# Patient Record
Sex: Female | Born: 1965 | Race: White | Hispanic: No | Smoking: Never smoker
Health system: Southern US, Community
[De-identification: ages and names within clinical notes are randomized; demographics above are authoritative.]

## PROBLEM LIST (undated history)

## (undated) HISTORY — PX: REDUCTION MAMMAPLASTY: SUR839

## (undated) HISTORY — PX: LASIK: SHX215

---

## 2005-01-22 ENCOUNTER — Ambulatory Visit: Payer: Self-pay | Admitting: Internal Medicine

## 2005-02-25 ENCOUNTER — Ambulatory Visit: Payer: Self-pay | Admitting: Gastroenterology

## 2005-05-05 ENCOUNTER — Ambulatory Visit: Payer: Self-pay | Admitting: Gastroenterology

## 2006-06-16 DIAGNOSIS — E039 Hypothyroidism, unspecified: Secondary | ICD-10-CM

## 2006-06-16 HISTORY — DX: Hypothyroidism, unspecified: E03.9

## 2007-03-23 ENCOUNTER — Ambulatory Visit: Payer: Self-pay | Admitting: Internal Medicine

## 2008-08-03 IMAGING — MG MAM DGTL SCREENING MAMMO W/CAD
1 series · 4 of 4 positions shown · non-contrast
Comparison: none

REASON FOR EXAM: Screening mammogram  Fax report to Dr. [REDACTED] #:
COMMENTS:

[Series 101: R CC · right · 4 of 4 slices shown]
[im 1/4]
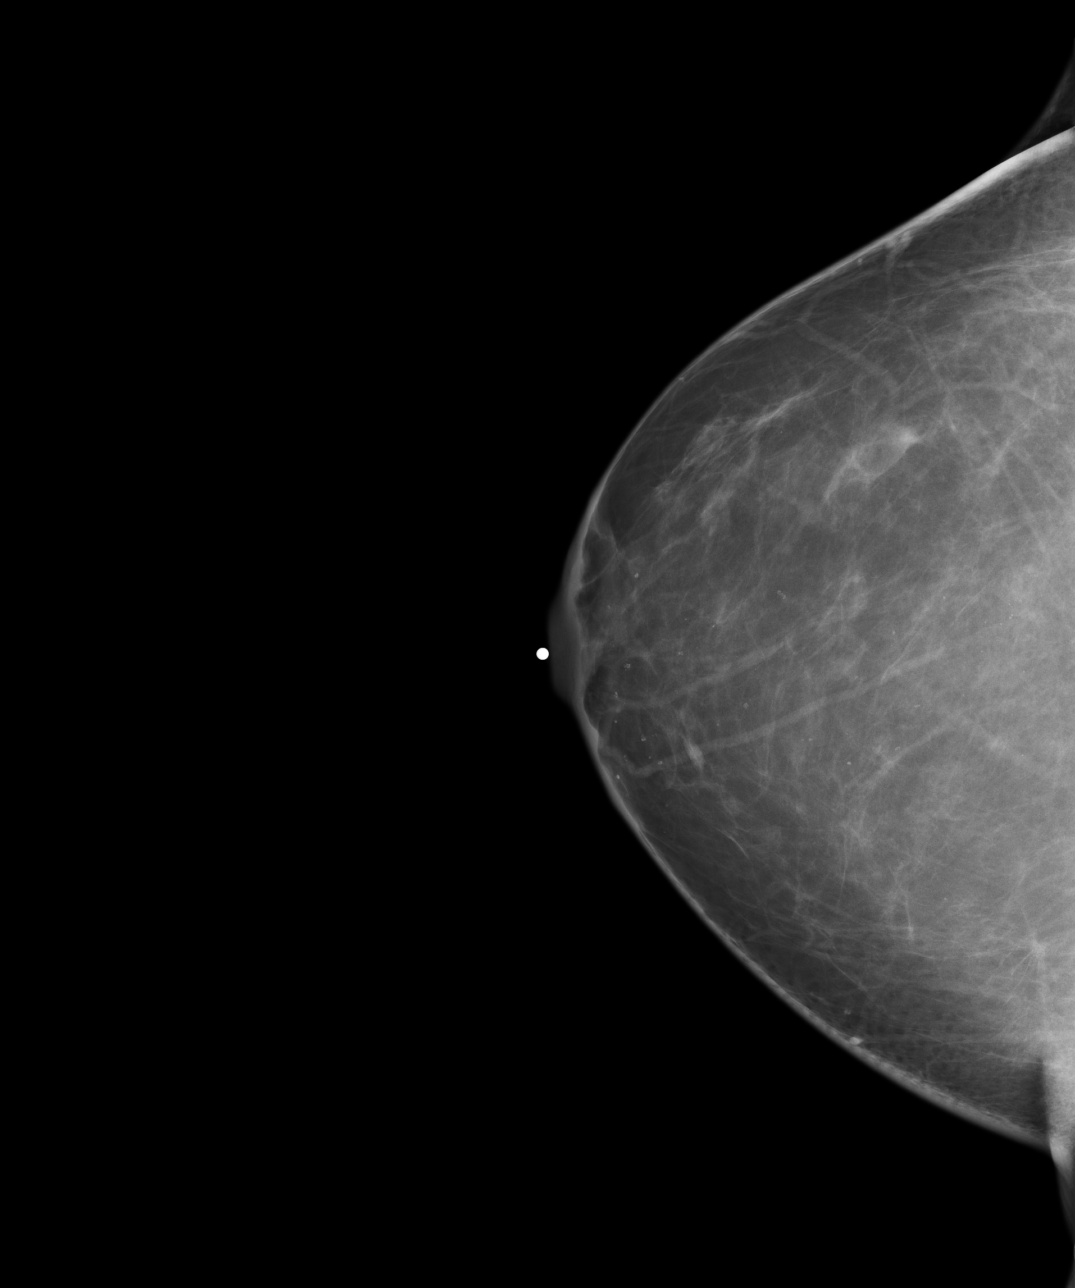
[im 2/4]
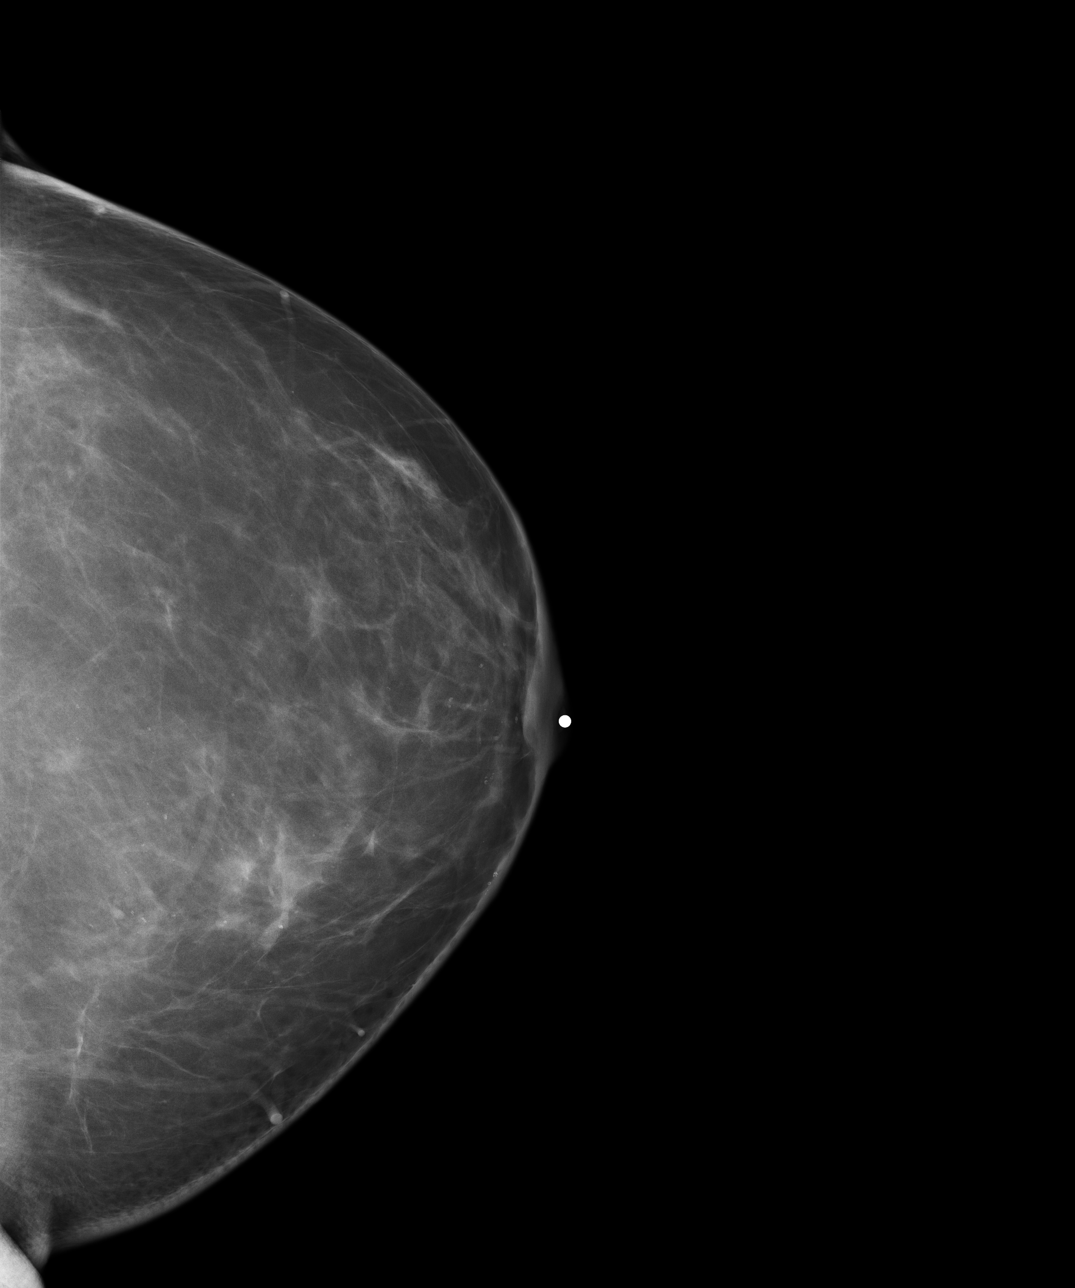
[im 3/4]
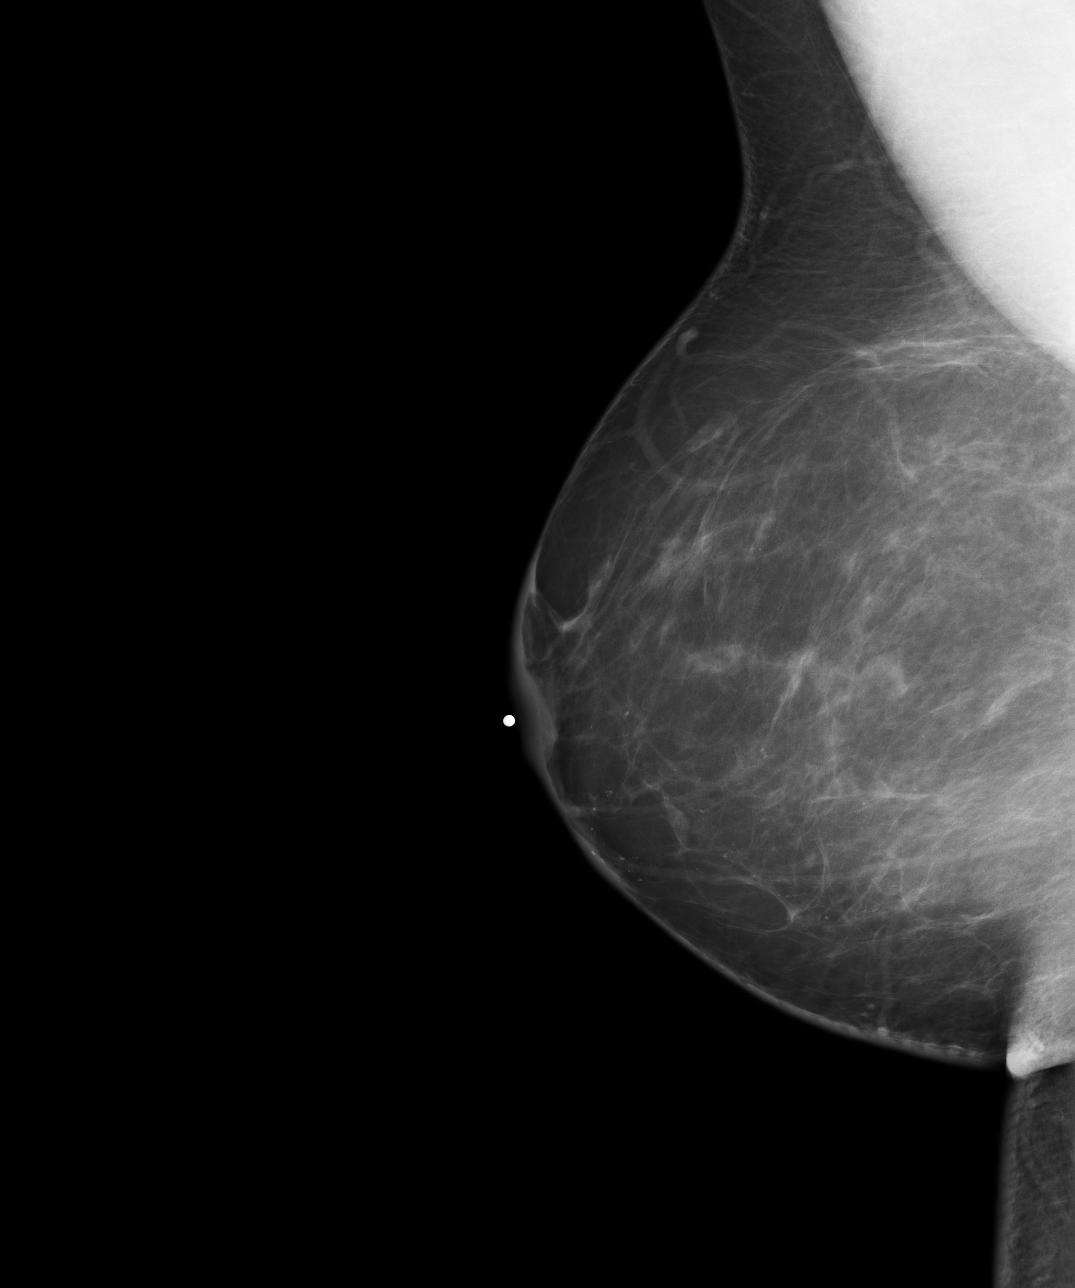
[im 4/4]
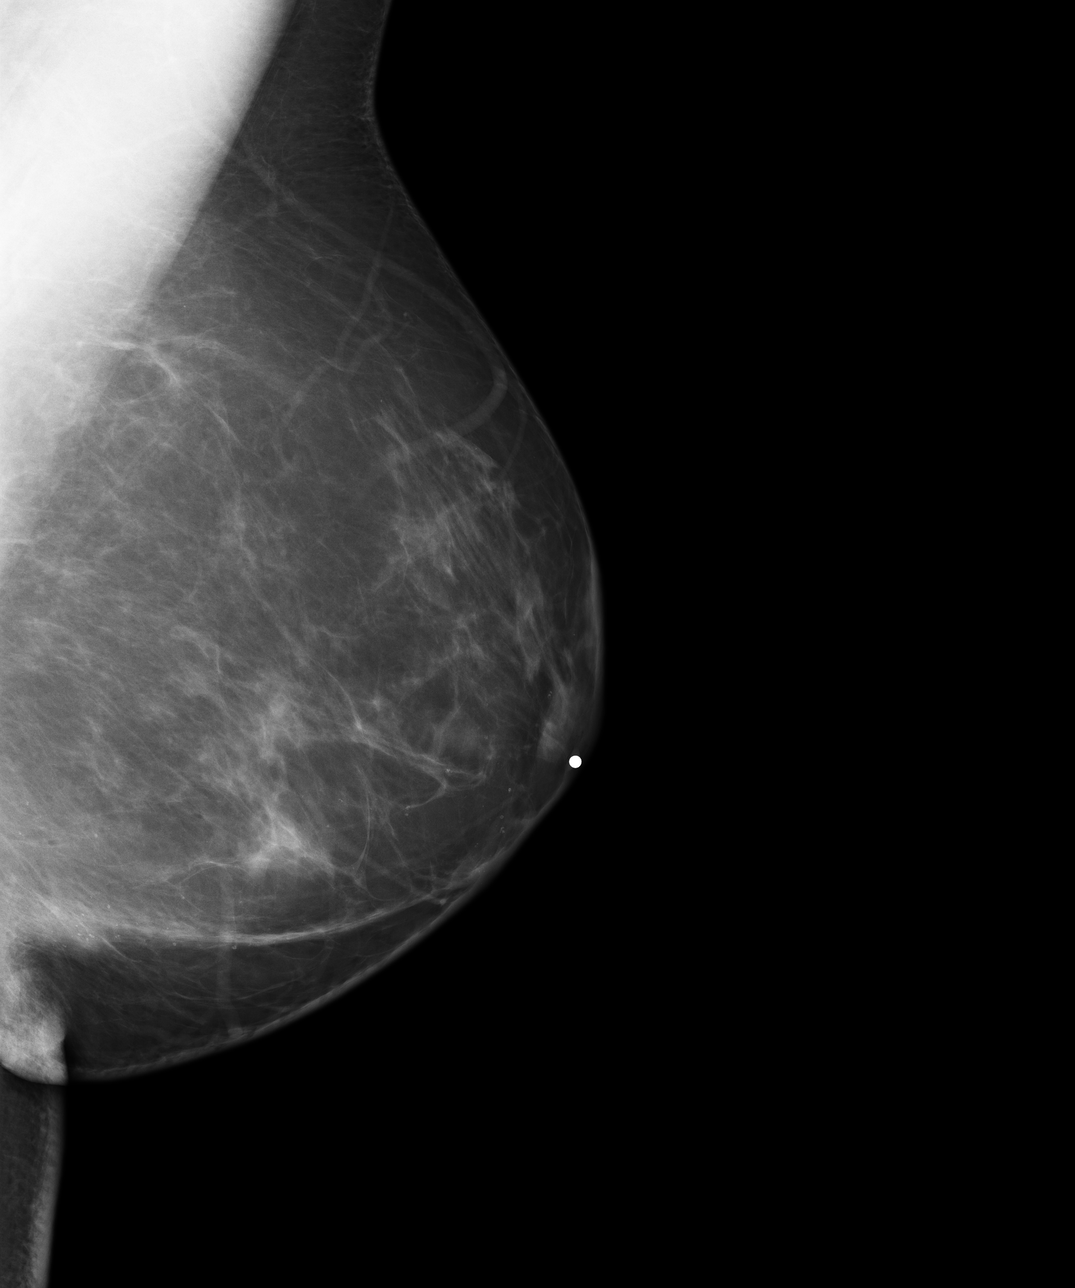

[4 of 4 positions shown; findings below may reference images not displayed]

PROCEDURE:     MAM - MAM DGTL SCREENING MAMMO W/CAD  - March 23, 2007 [DATE]

RESULT:     There are no prior mammograms available for comparison.

The current exam reveals no mass or malignant appearing calcifications.
Benign appearing calcifications are noted bilaterally. The breast parenchyma
is predominantly fatty.
IMPRESSION: 1.     Bilaterally benign appearing mammography.
2.     Annual screening mammography is recommended.
3.     BI-RADS: Category 2 - Benign Finding.

Thank you for this opportunity to contribute to the care of your patient.

A NEGATIVE MAMMOGRAM REPORT DOES NOT PRECLUDE BIOPSY OR OTHER EVALUATION OF
A CLINICALLY PALPABLE OR OTHERWISE SUSPICIOUS MASS OR LESION. BREAST CANCER
MAY NOT BE DETECTED BY MAMMOGRAPHY IN UP TO 10% OF CASES.

## 2010-01-11 ENCOUNTER — Ambulatory Visit: Payer: Self-pay | Admitting: Obstetrics and Gynecology

## 2011-05-25 IMAGING — MG MAM DGTL SCREENING MAMMO W/CAD
1 series · 4 of 4 positions shown · non-contrast
Comparison: 03-21-07.

REASON FOR EXAM: SCR
COMMENTS:

PROCEDURE:     MAM - MAM DGTL SCREENING MAMMO W/CAD  - January 11, 2010 [DATE]
RESULT:

[Series 4710: R CC · right · 4 of 4 slices shown]
[im 1/4]
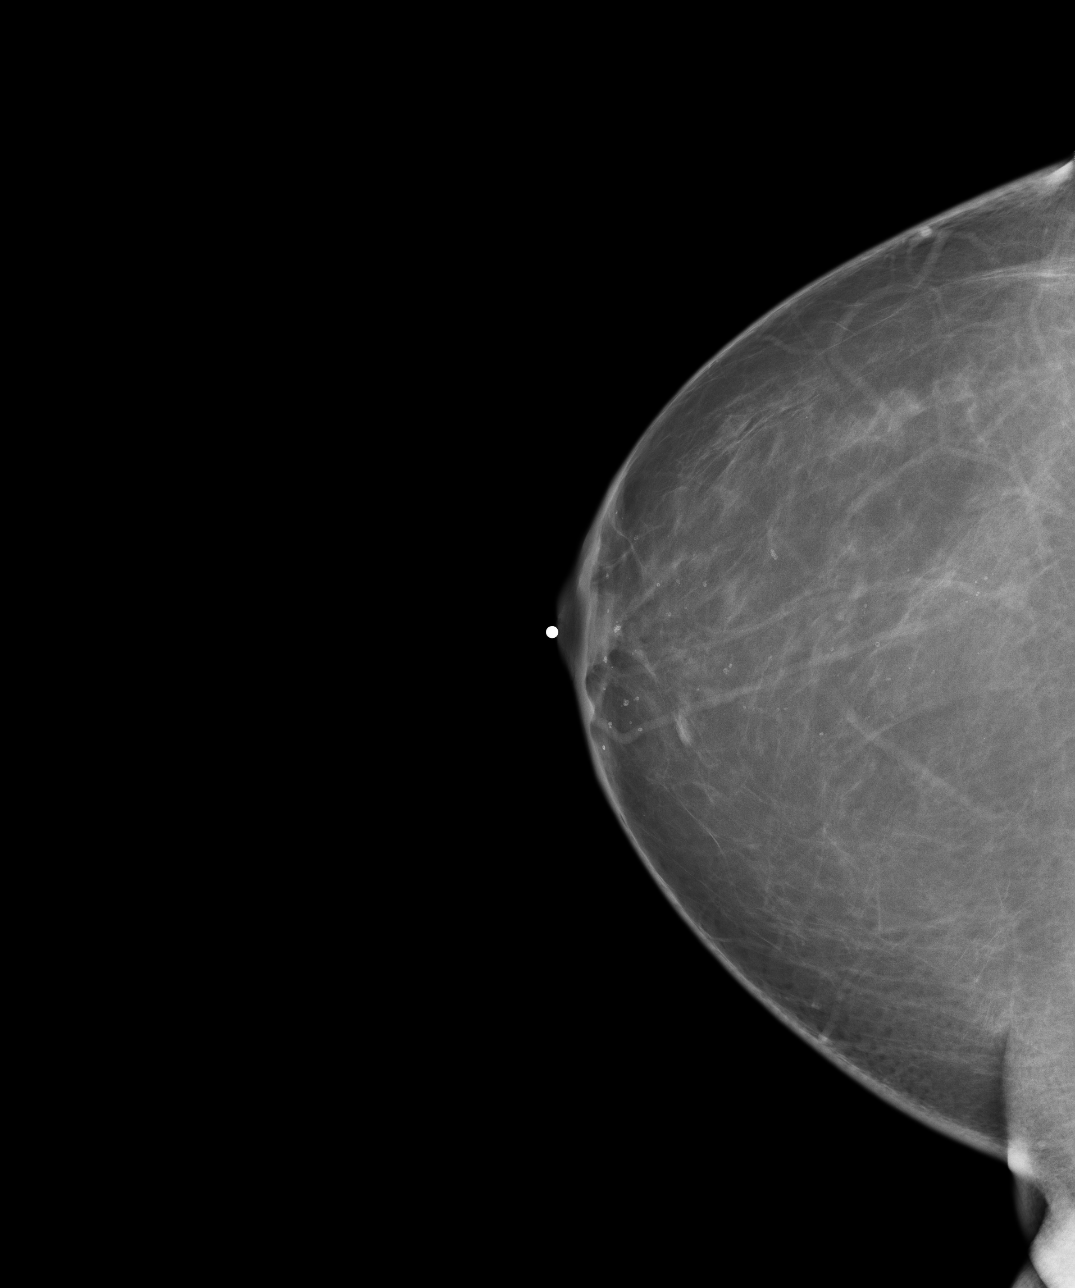
[im 2/4]
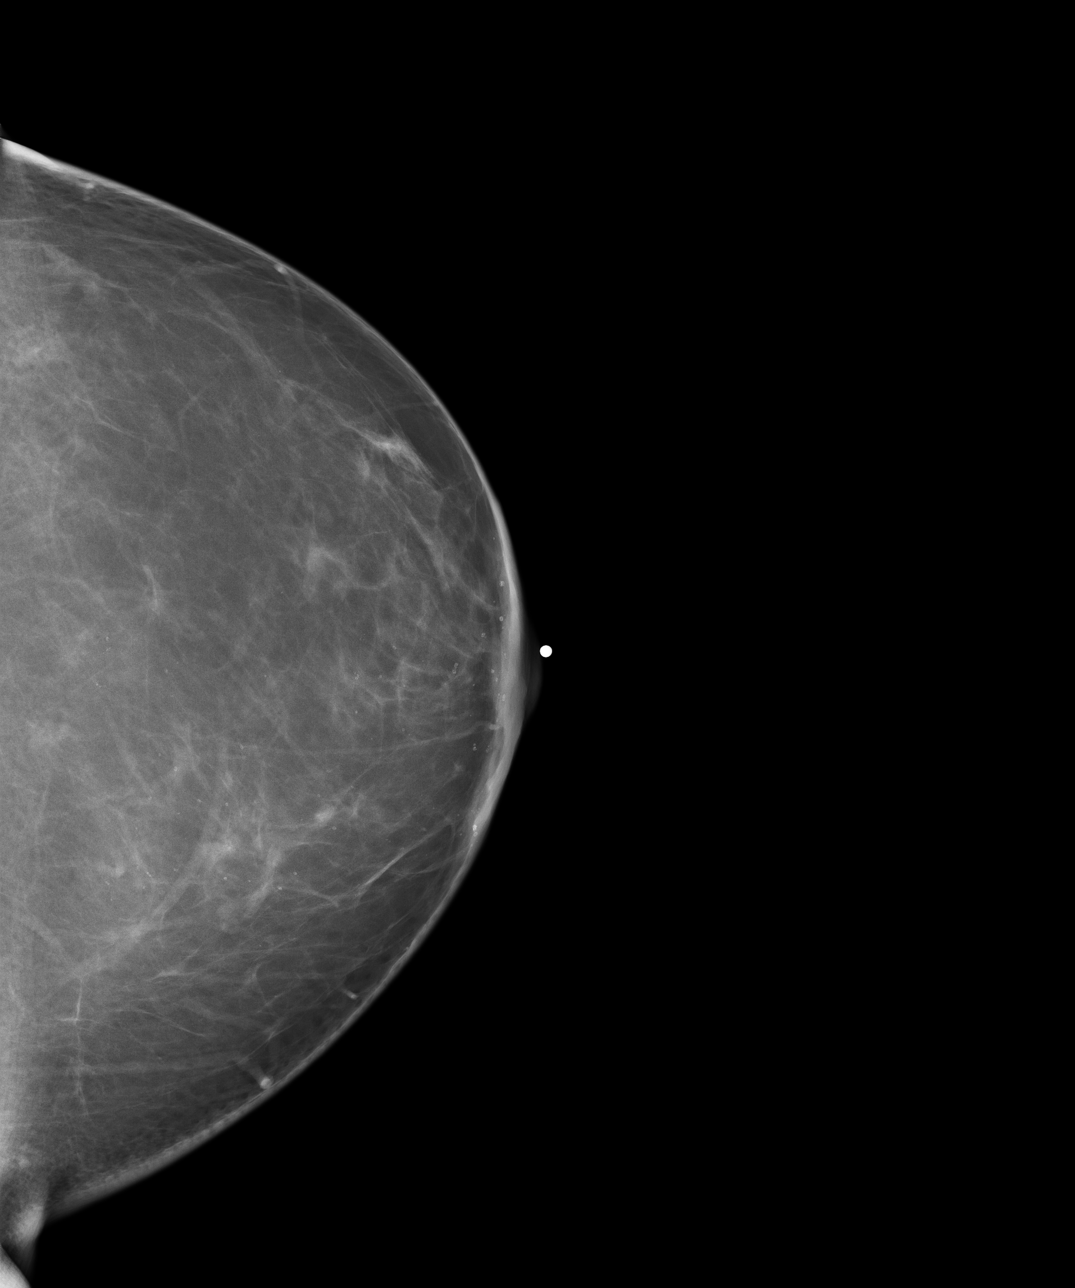
[im 3/4]
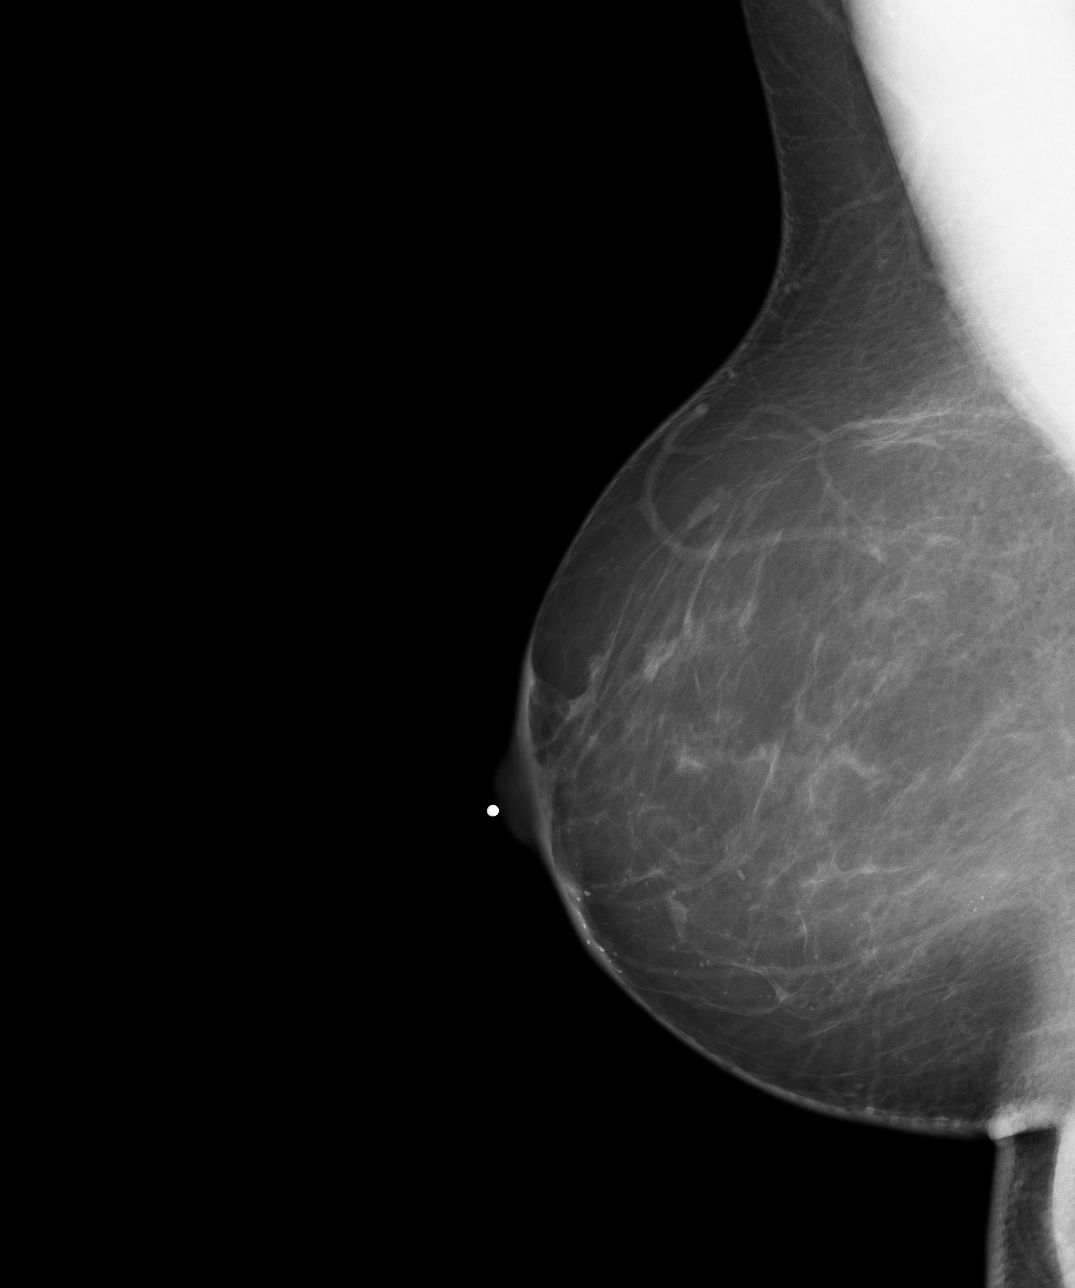
[im 4/4]
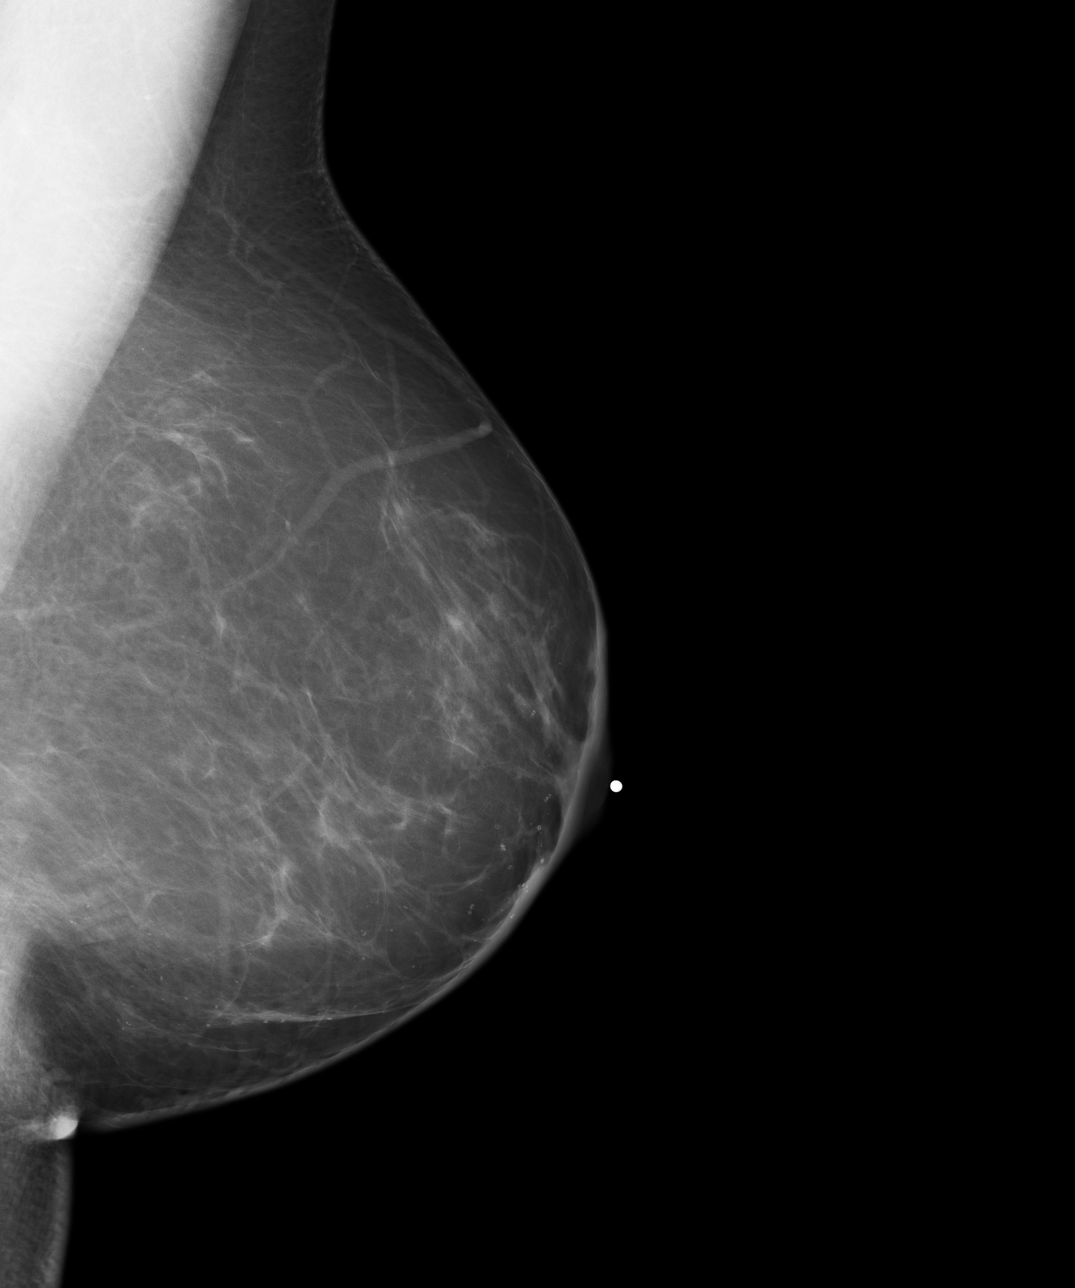

[4 of 4 positions shown; findings below may reference images not displayed]

FINDINGS: There is scattered fibroglandular tissue.  There are scattered,
benign appearing calcifications which are similar to the prior study. No new
or suspicious masses or calcifications are identified.
IMPRESSION: BI-RADS: Category 2 - Benign Findings

Continued annual screening mammography.

A NEGATIVE MAMMOGRAM REPORT DOES NOT PRECLUDE BIOPSY OR OTHER EVALUATION OF
A CLINICALLY PALPABLE OR OTHERWISE SUSPICIOUS MASS OR LESION. BREAST CANCER
MAY NOT BE DETECTED BY MAMMOGRAPHY IN UP TO 10% OF CASES.

## 2011-08-05 ENCOUNTER — Ambulatory Visit: Payer: Self-pay | Admitting: Obstetrics and Gynecology

## 2012-10-29 ENCOUNTER — Ambulatory Visit: Payer: Self-pay | Admitting: Obstetrics and Gynecology

## 2012-12-16 IMAGING — MG MMM DGT SCR NO ORDER W/CAD
1 series · 4 of 4 positions shown · non-contrast
Comparison: none

REASON FOR EXAM: SCR MAMMO NO ORDER
COMMENTS:

[Series 5450: R CC · right · 4 of 4 slices shown]
[im 1/4]
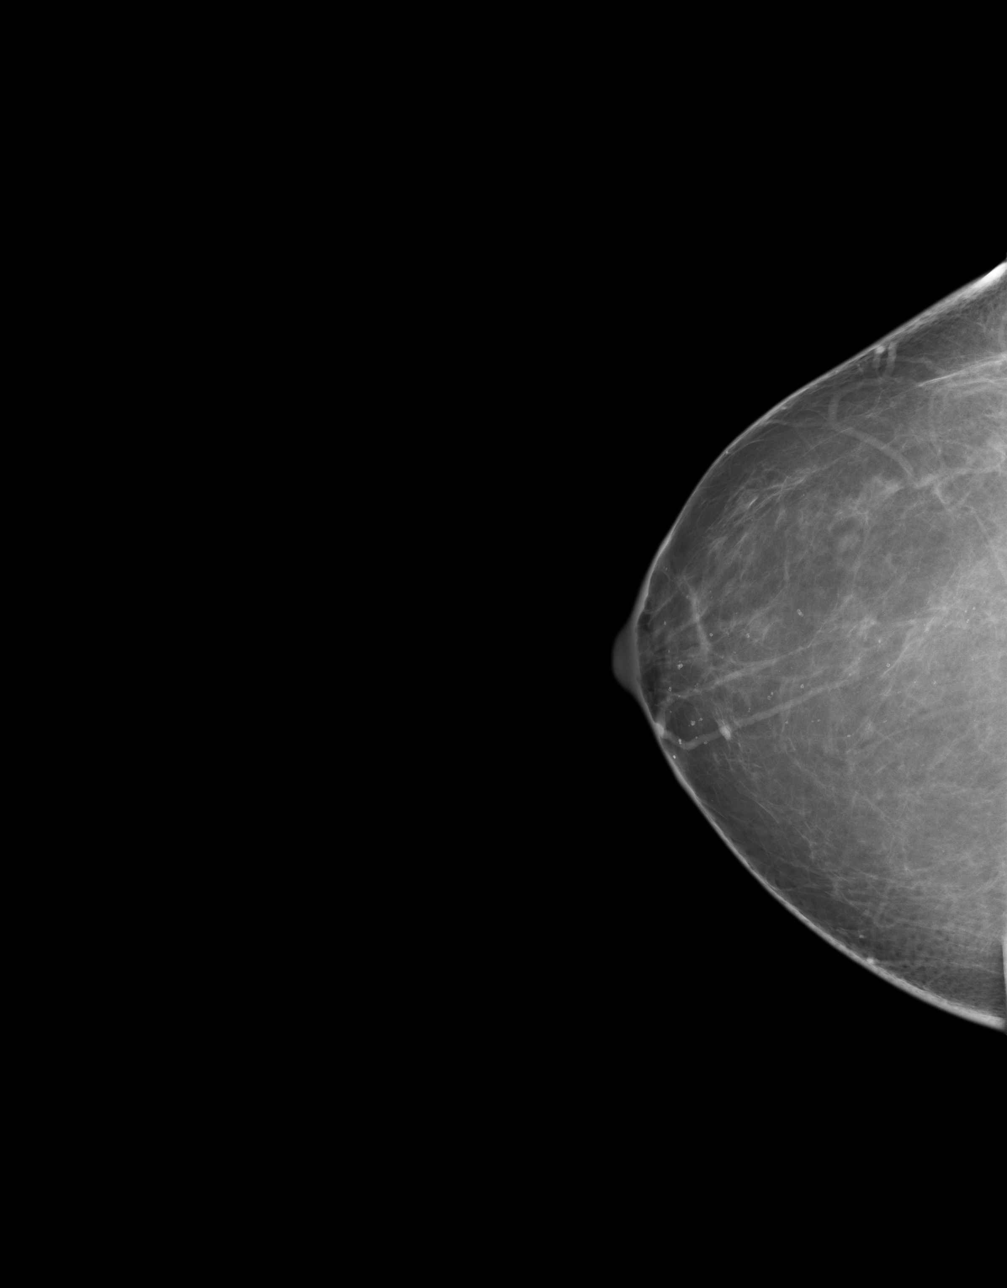
[im 2/4]
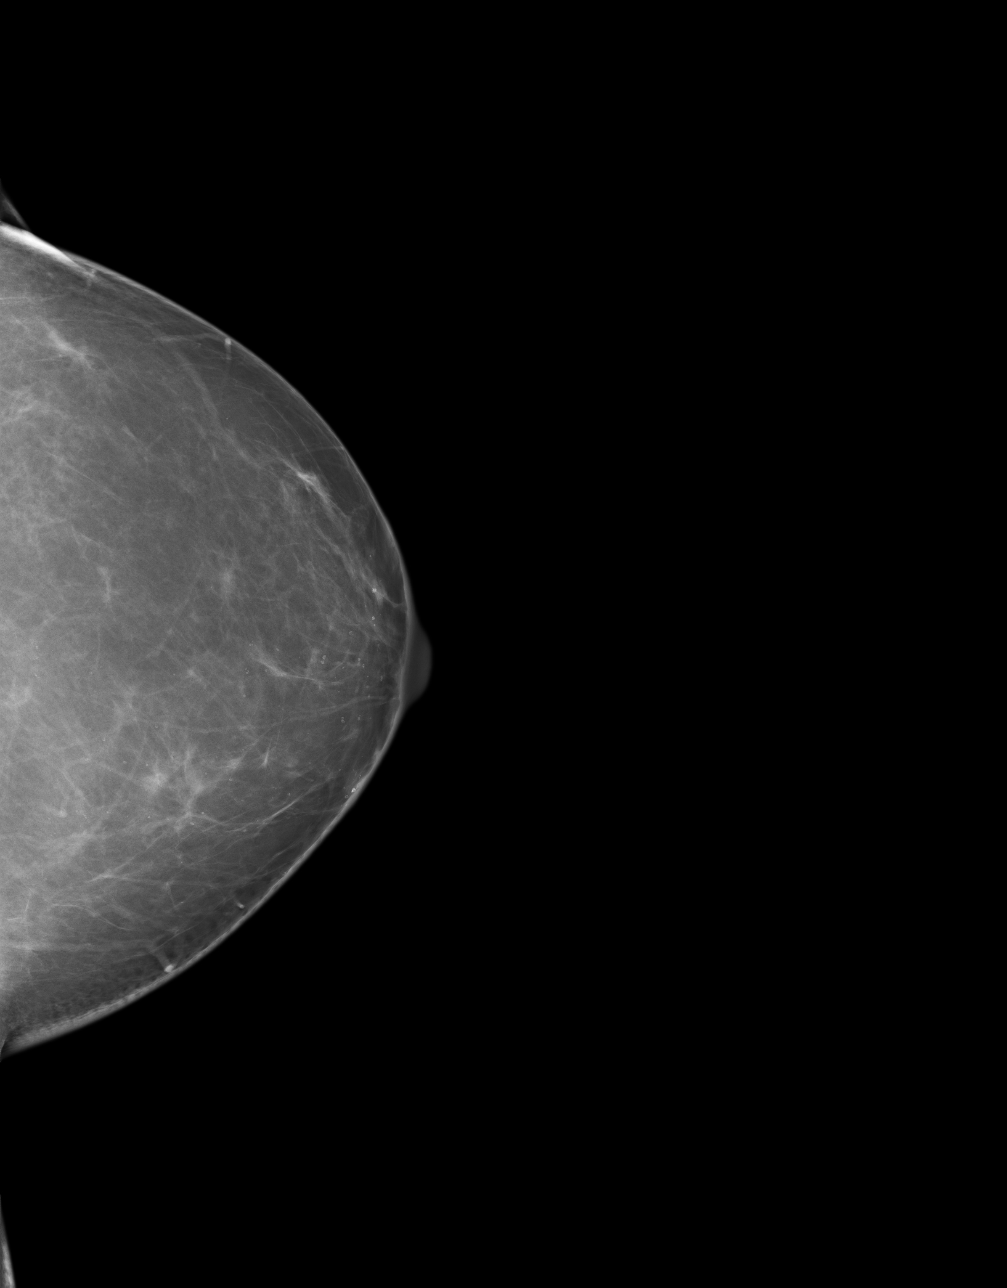
[im 3/4]
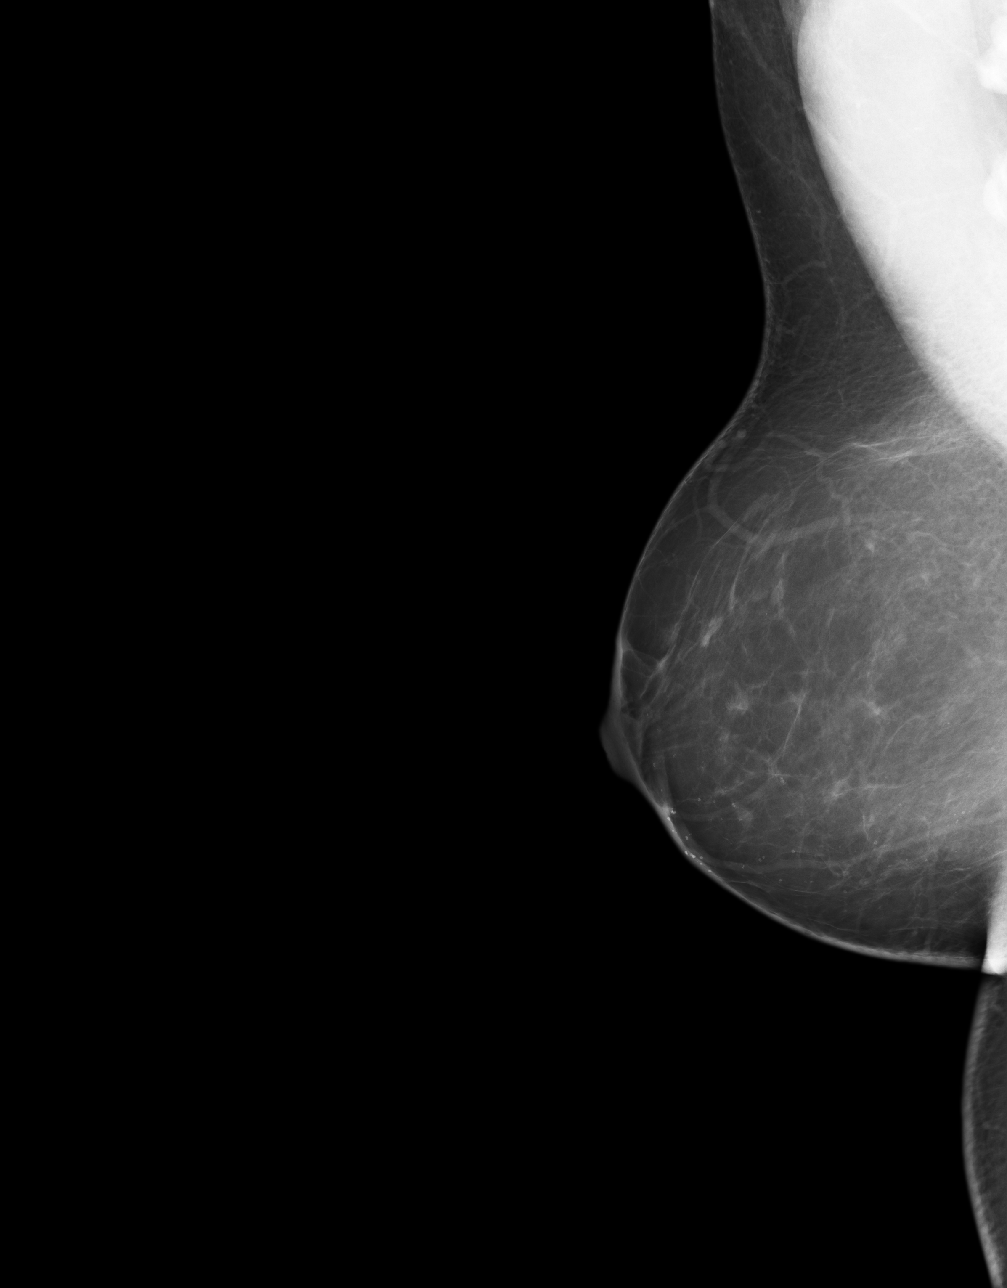
[im 4/4]
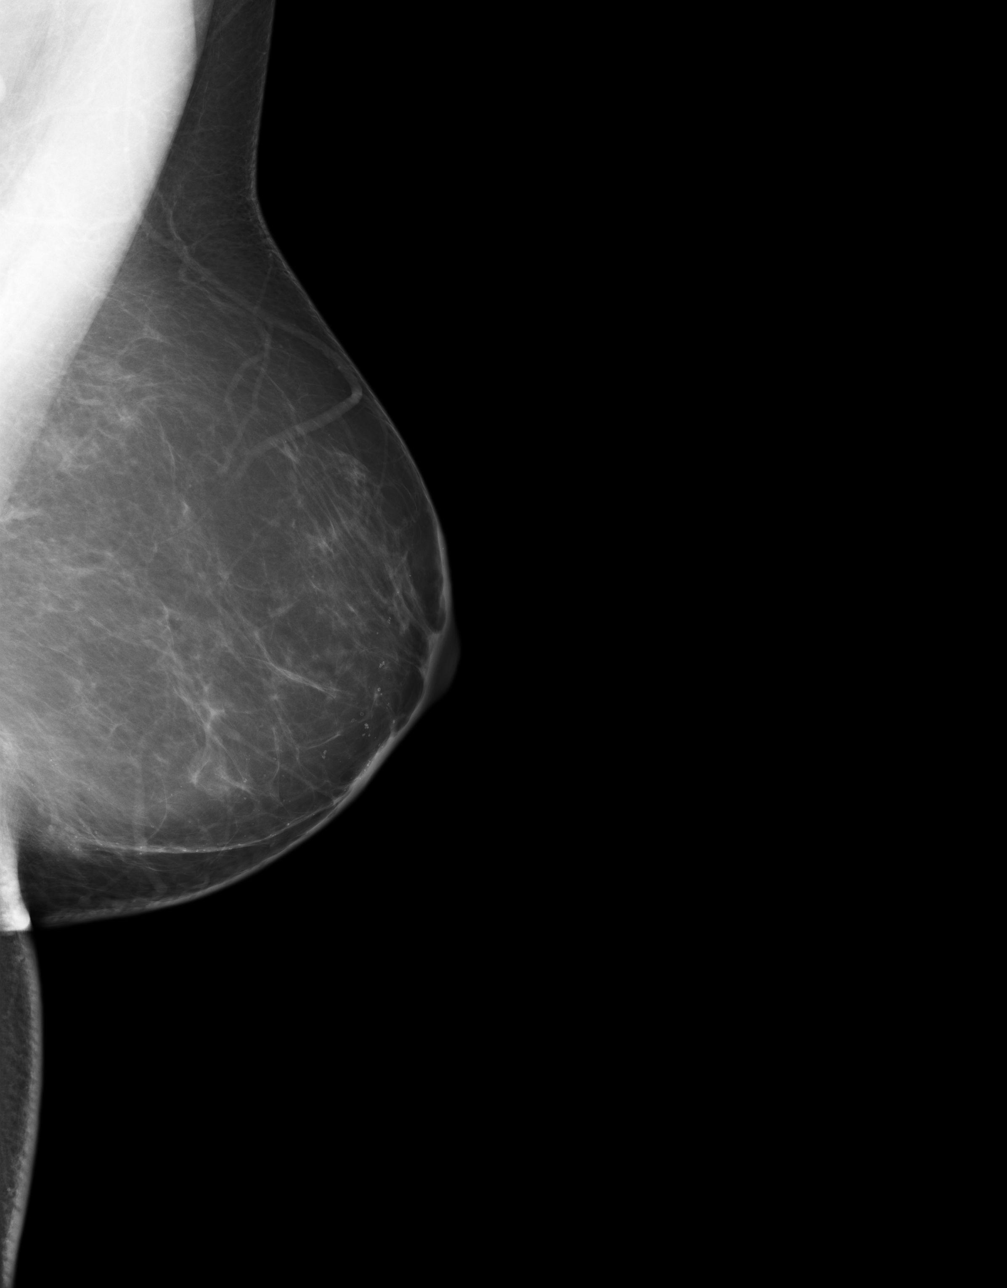

[4 of 4 positions shown; findings below may reference images not displayed]

PROCEDURE:     MMM - MMM DGT SCR NO ORDER W/CAD  - August 05, 2011  [DATE]

RESULT:

Comparison is made to prior examinations January 11, 2010 and March 23, 2007.

There are scattered fibroglandular densities bilaterally. No mass or
malignant appearing microcalcifications are seen. Benign appearing
calcifications are again noted bilaterally.
IMPRESSION: 1.  Bilaterally benign appearing screening mammography.
2.  Continued annual screening mammography is recommended.
3.  BI-RADS: Category 2 - Benign Findings.

Thank you for this opportunity to contribute to the care of your patient.

A NEGATIVE MAMMOGRAM REPORT DOES NOT PRECLUDE BIOPSY OR OTHER EVALUATION OF
A CLINICALLY PALPABLE OR OTHERWISE SUSPICIOUS MASS OR LESION. BREAST CANCER
MAY NOT BE DETECTED BY MAMMOGRAPHY IN UP TO 10% OF CASES.

## 2014-03-12 IMAGING — MG MAM DGTL SCRN MAM NO ORDER W/CAD
1 series · 5 of 5 positions shown · non-contrast
Comparison: none

REASON FOR EXAM: SCR MAMMO NO ORDER
COMMENTS:

[R CC · right · 5 of 5 slices shown]
[im 1/5]
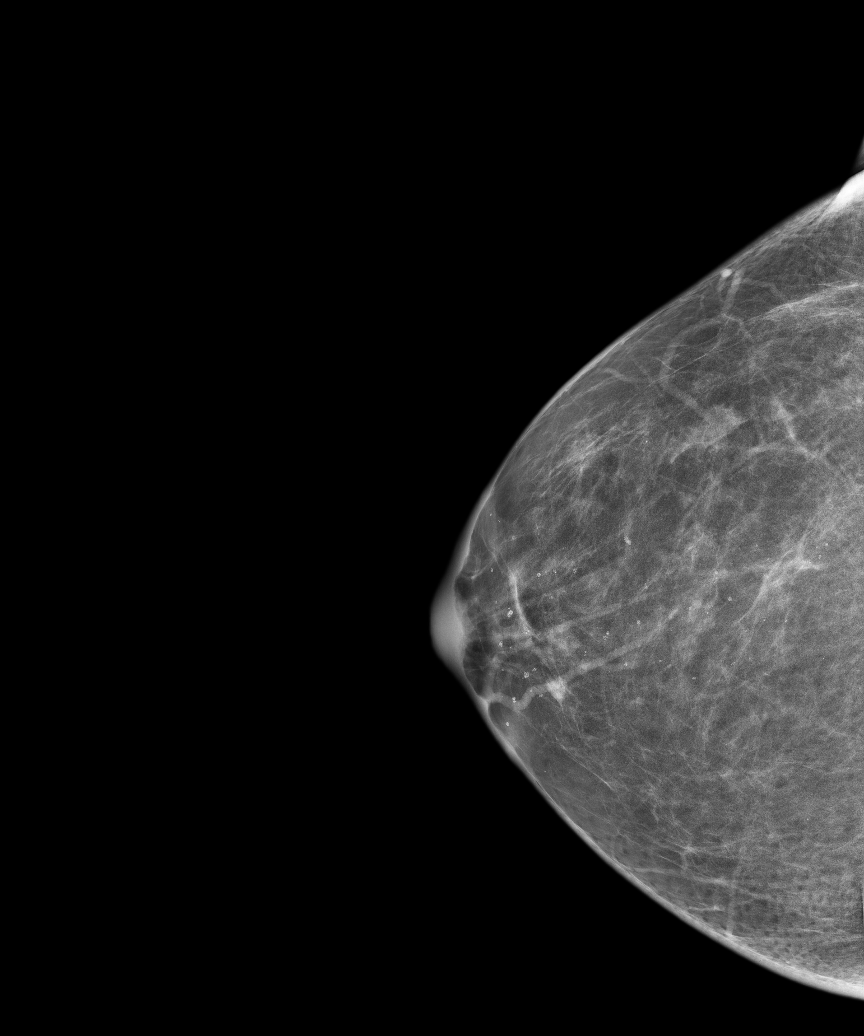
[im 2/5]
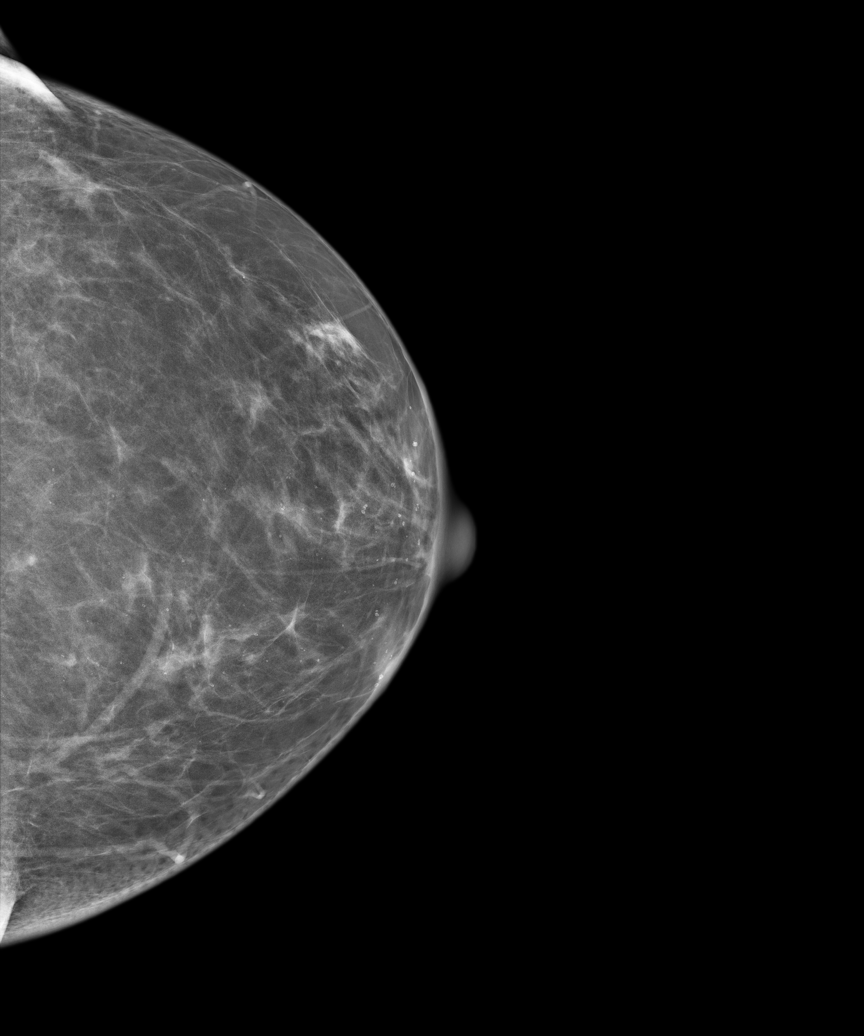
[im 3/5]
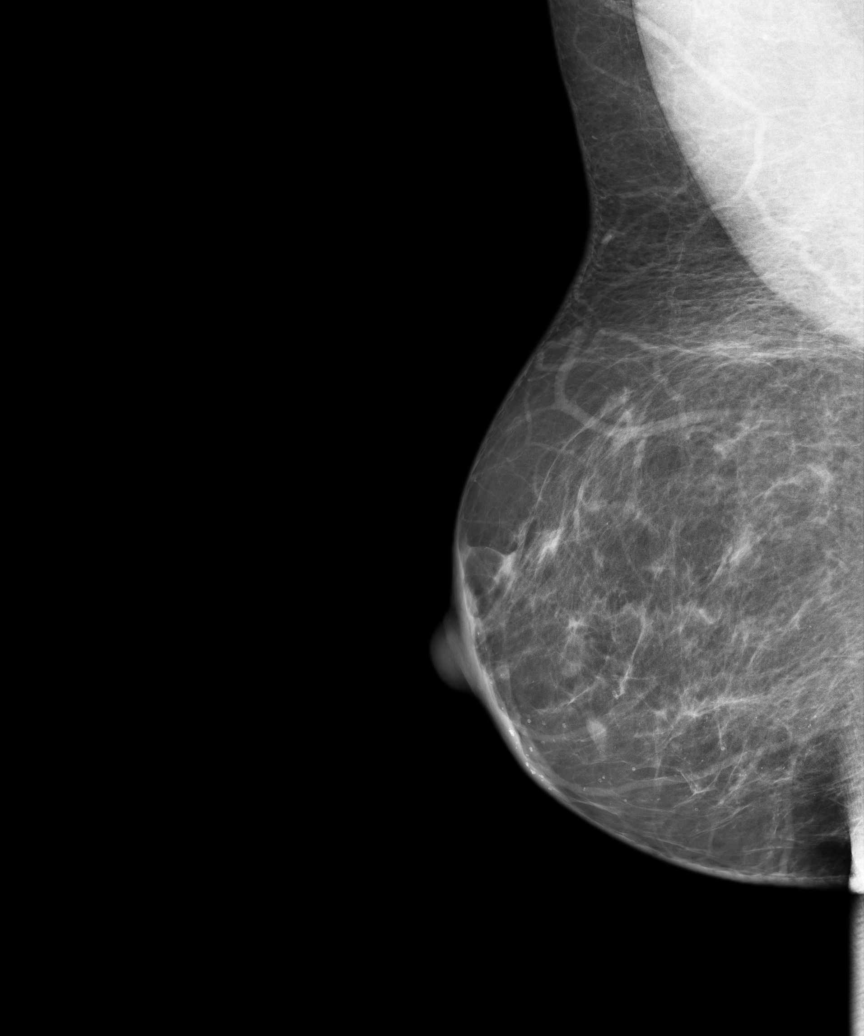
[im 4/5]
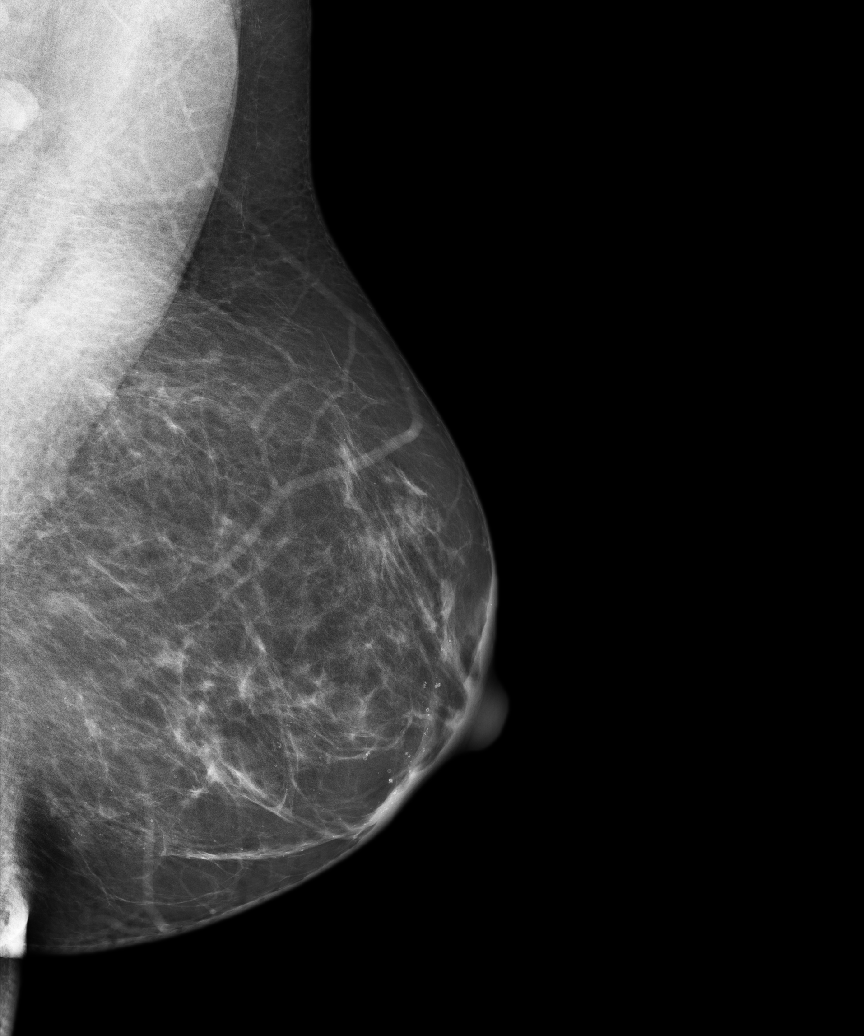
[im 5/5]
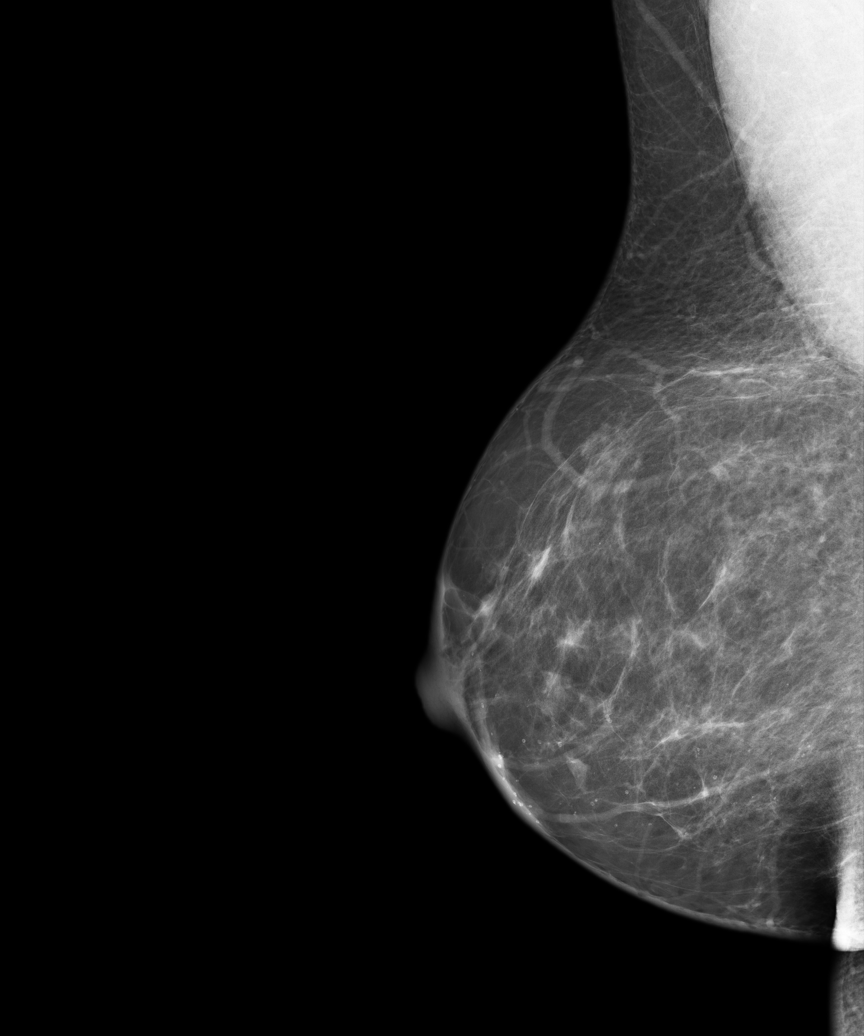

[5 of 5 positions shown; findings below may reference images not displayed]

PROCEDURE:     MAM - MAM DGTL SCRN MAM NO ORDER W/CAD  - October 29, 2012 [DATE]

RESULT:     There is reported family history of breast cancer in the
patient's maternal great aunt. Patient reports previous bilateral breast
reduction in 8003. Comparison is made to previous digital mammographic
images of 08/05/2011, 01/11/2010 and 03/23/2007.

There is scattered fibroglandular density in both breasts with multiple
predominantly round or punctate calcifications in the mid to anterior
portion of the right breast and left breast. There is no developing or
dominant mass, malignant calcification or architectural distortion. The
mammographic appearance is stable.
IMPRESSION: Stable, benign appearing bilateral mammogram. Please
continue to encourage annual mammographic followup and monthly breast self
exam. BREAST COMPOSITION: The breast composition is SCATTERED FIBROGLANDULAR
TISSUE (glandular tissue is 25-50%) BI-RADS: Category 2- Benign Finding

A NEGATIVE MAMMOGRAM REPORT DOES NOT PRECLUDE BIOPSY OR OTHER EVALUATION OF
A CLINICALLY PALPABLE OR OTHERWISE SUSPICIOUS MASS OR LESION. BREAST CANCER
MAY NOT BE DETECTED IN UP TO 10% OF CASES.

[REDACTED]

## 2016-07-08 DIAGNOSIS — Z13220 Encounter for screening for lipoid disorders: Secondary | ICD-10-CM | POA: Diagnosis not present

## 2016-07-08 DIAGNOSIS — Z131 Encounter for screening for diabetes mellitus: Secondary | ICD-10-CM | POA: Diagnosis not present

## 2016-07-08 DIAGNOSIS — Z130 Encounter for screening for diseases of the blood and blood-forming organs and certain disorders involving the immune mechanism: Secondary | ICD-10-CM | POA: Diagnosis not present

## 2016-07-08 DIAGNOSIS — E039 Hypothyroidism, unspecified: Secondary | ICD-10-CM | POA: Diagnosis not present

## 2016-07-15 ENCOUNTER — Other Ambulatory Visit: Payer: Self-pay | Admitting: Obstetrics and Gynecology

## 2016-07-15 DIAGNOSIS — Z23 Encounter for immunization: Secondary | ICD-10-CM | POA: Diagnosis not present

## 2016-07-15 DIAGNOSIS — Z01419 Encounter for gynecological examination (general) (routine) without abnormal findings: Secondary | ICD-10-CM | POA: Diagnosis not present

## 2016-07-15 DIAGNOSIS — E039 Hypothyroidism, unspecified: Secondary | ICD-10-CM | POA: Diagnosis not present

## 2016-07-15 DIAGNOSIS — E782 Mixed hyperlipidemia: Secondary | ICD-10-CM | POA: Diagnosis not present

## 2016-07-15 DIAGNOSIS — Z1231 Encounter for screening mammogram for malignant neoplasm of breast: Secondary | ICD-10-CM

## 2016-07-15 DIAGNOSIS — Z1211 Encounter for screening for malignant neoplasm of colon: Secondary | ICD-10-CM | POA: Diagnosis not present

## 2016-07-15 DIAGNOSIS — M2550 Pain in unspecified joint: Secondary | ICD-10-CM | POA: Diagnosis not present

## 2016-07-23 DIAGNOSIS — T2122XA Burn of second degree of abdominal wall, initial encounter: Secondary | ICD-10-CM | POA: Diagnosis not present

## 2016-07-23 DIAGNOSIS — T2112XA Burn of first degree of abdominal wall, initial encounter: Secondary | ICD-10-CM | POA: Diagnosis not present

## 2016-07-23 DIAGNOSIS — T22122A Burn of first degree of left elbow, initial encounter: Secondary | ICD-10-CM | POA: Diagnosis not present

## 2016-07-25 DIAGNOSIS — R079 Chest pain, unspecified: Secondary | ICD-10-CM | POA: Diagnosis not present

## 2016-07-25 DIAGNOSIS — I10 Essential (primary) hypertension: Secondary | ICD-10-CM | POA: Diagnosis not present

## 2016-07-26 DIAGNOSIS — R079 Chest pain, unspecified: Secondary | ICD-10-CM | POA: Diagnosis not present

## 2016-07-31 DIAGNOSIS — R079 Chest pain, unspecified: Secondary | ICD-10-CM | POA: Diagnosis not present

## 2016-08-04 DIAGNOSIS — E782 Mixed hyperlipidemia: Secondary | ICD-10-CM | POA: Diagnosis not present

## 2016-08-04 DIAGNOSIS — R079 Chest pain, unspecified: Secondary | ICD-10-CM | POA: Diagnosis not present

## 2016-08-04 DIAGNOSIS — I208 Other forms of angina pectoris: Secondary | ICD-10-CM | POA: Diagnosis not present

## 2016-08-14 DIAGNOSIS — E782 Mixed hyperlipidemia: Secondary | ICD-10-CM | POA: Diagnosis not present

## 2016-08-14 DIAGNOSIS — I10 Essential (primary) hypertension: Secondary | ICD-10-CM | POA: Diagnosis not present

## 2016-08-14 DIAGNOSIS — I208 Other forms of angina pectoris: Secondary | ICD-10-CM | POA: Diagnosis not present

## 2016-08-14 DIAGNOSIS — R079 Chest pain, unspecified: Secondary | ICD-10-CM | POA: Diagnosis not present

## 2016-10-02 DIAGNOSIS — S80862A Insect bite (nonvenomous), left lower leg, initial encounter: Secondary | ICD-10-CM | POA: Diagnosis not present

## 2016-10-02 DIAGNOSIS — L821 Other seborrheic keratosis: Secondary | ICD-10-CM | POA: Diagnosis not present

## 2016-10-02 DIAGNOSIS — D225 Melanocytic nevi of trunk: Secondary | ICD-10-CM | POA: Diagnosis not present

## 2016-10-02 DIAGNOSIS — D485 Neoplasm of uncertain behavior of skin: Secondary | ICD-10-CM | POA: Diagnosis not present

## 2016-11-24 ENCOUNTER — Ambulatory Visit
Admission: RE | Admit: 2016-11-24 | Discharge: 2016-11-24 | Disposition: A | Discharge status: Home / Self Care | Payer: Commercial Managed Care - HMO | Source: Ambulatory Visit | Attending: Obstetrics and Gynecology | Admitting: Obstetrics and Gynecology

## 2016-11-24 ENCOUNTER — Encounter: Payer: Self-pay | Admitting: Radiology

## 2016-11-24 DIAGNOSIS — Z1231 Encounter for screening mammogram for malignant neoplasm of breast: Secondary | ICD-10-CM | POA: Diagnosis not present

## 2018-05-26 DIAGNOSIS — Z23 Encounter for immunization: Secondary | ICD-10-CM | POA: Diagnosis not present

## 2018-12-24 ENCOUNTER — Other Ambulatory Visit: Payer: Self-pay | Admitting: Obstetrics and Gynecology

## 2018-12-24 DIAGNOSIS — Z13820 Encounter for screening for osteoporosis: Secondary | ICD-10-CM

## 2020-04-24 ENCOUNTER — Other Ambulatory Visit: Payer: Self-pay

## 2020-04-24 ENCOUNTER — Encounter: Payer: Self-pay | Admitting: Emergency Medicine

## 2020-04-24 ENCOUNTER — Ambulatory Visit
Admission: EM | Admit: 2020-04-24 | Discharge: 2020-04-24 | Disposition: A | Discharge status: Home / Self Care | Payer: 59 | Attending: Family Medicine | Admitting: Family Medicine

## 2020-04-24 DIAGNOSIS — H60392 Other infective otitis externa, left ear: Secondary | ICD-10-CM | POA: Diagnosis not present

## 2020-04-24 DIAGNOSIS — H9202 Otalgia, left ear: Secondary | ICD-10-CM

## 2020-04-24 MED ORDER — NEOMYCIN-POLYMYXIN-HC 3.5-10000-1 OT SUSP: 4.0000 [drp] | Freq: Three times a day (TID) | OTIC | 0 refills | Status: AC

## 2020-04-24 NOTE — Discharge Instructions (Signed)
I have sent in cortisporin drops for you to use 4 drops in the left ear three times per day  Follow up with this office or with primary care if symptoms are persisting.  Follow up in the ER for high fever, trouble swallowing, trouble breathing, other concerning symptoms.

## 2020-04-24 NOTE — ED Provider Notes (Signed)
[facility]   [id number] 04/24/20 Arrival Time: 1033  CC: EAR PAIN  SUBJECTIVE: History from: patient.  Carol Ball is a 54 y.o. female who presents with left ear pain for the past 5 days. Denies a precipitating event, such as swimming or wearing ear plugs. Patient states the pain is constant and achy in character. Patient has tried old ear drops without relief. Symptoms are made worse with lying down. Reports similar symptoms in the past. Reports that she thinks she scratched her ear canal in her sleep with long fingernails and that this could be the cause of her infection. Denies fever, chills, fatigue, sinus pain, rhinorrhea, ear discharge, sore throat, SOB, wheezing, chest pain, nausea, changes in bowel or bladder habits.    ROS: As per HPI.  All other pertinent ROS negative.     History reviewed. No pertinent past medical history. Past Surgical History:  Procedure Laterality Date  . REDUCTION MAMMAPLASTY Bilateral    Allergies  Allergen Reactions  . Codeine     " I vomit it back up"    No current facility-administered medications on file prior to encounter.   No current outpatient medications on file prior to encounter.   Social History   Socioeconomic History  . Marital status: Divorced    Spouse name: Not on file  . Number of children: Not on file  . Years of education: Not on file  . Highest education level: Not on file  Occupational History  . Not on file  Tobacco Use  . Smoking status: Never Smoker  . Smokeless tobacco: Never Used  Substance and Sexual Activity  . Alcohol use: Never  . Drug use: Never  . Sexual activity: Not on file  Other Topics Concern  . Not on file  Social History Narrative  . Not on file   Social Determinants of Health   Financial Resource Strain:   . Difficulty of Paying Living Expenses: Not on file  Food Insecurity:   . Worried About Charity fundraiser in the Last Year: Not on file  . Ran Out of Food in the Last  Year: Not on file  Transportation Needs:   . Lack of Transportation (Medical): Not on file  . Lack of Transportation (Non-Medical): Not on file  Physical Activity:   . Days of Exercise per Week: Not on file  . Minutes of Exercise per Session: Not on file  Stress:   . Feeling of Stress : Not on file  Social Connections:   . Frequency of Communication with Friends and Family: Not on file  . Frequency of Social Gatherings with Friends and Family: Not on file  . Attends Religious Services: Not on file  . Active Member of Clubs or Organizations: Not on file  . Attends Archivist Meetings: Not on file  . Marital Status: Not on file  Intimate Partner Violence:   . Fear of Current or Ex-Partner: Not on file  . Emotionally Abused: Not on file  . Physically Abused: Not on file  . Sexually Abused: Not on file   Family History  Problem Relation Age of Onset  . Breast cancer Maternal Aunt     OBJECTIVE:  Vitals:   04/24/20 1113  BP: (!) 150/96  Pulse: 69  Resp: 16  Temp: 98.5 F (36.9 C)  TempSrc: Oral  SpO2: 98%     General appearance: alert; appears fatigued HEENT: Ears: R EAC clear, L EAC with erythema, swelling, drainage, tenderness, TMs  pearly gray with visible cone of light, without erythema; Eyes: PERRL, EOMI grossly; Sinuses nontender to palpation; Nose: clear rhinorrhea; Throat: oropharynx mildly erythematous, tonsils 1+ without white tonsillar exudates, uvula midline Neck: supple without LAD Lungs: unlabored respirations, symmetrical air entry; cough: absent; no respiratory distress Heart: regular rate and rhythm.  Radial pulses 2+ symmetrical bilaterally Skin: warm and dry Psychological: alert and cooperative; normal mood and affect  Imaging: No results found.   ASSESSMENT & PLAN:  1. Other infective acute otitis externa of left ear   2. Acute otalgia, left     Meds ordered this encounter  Medications  . neomycin-polymyxin-hydrocortisone  (CORTISPORIN) 3.5-10000-1 OTIC suspension    Sig: Place 4 drops into the left ear 3 (three) times daily.    Dispense:  10 mL    Refill:  0    Order Specific Question:   Supervising Provider    Answer:   Chase Picket [[id number]]    Rest and drink plenty of fluids Prescribed Cortisporin drops Take medications as directed and to completion Continue to use OTC ibuprofen and/ or tylenol as needed for pain control Follow up with PCP if symptoms persists Return here or go to the ER if you have any new or worsening symptoms   Reviewed expectations re: course of current medical issues. Questions answered. Outlined signs and symptoms indicating need for more acute intervention. Patient verbalized understanding. After Visit Summary given.         Faustino Congress, NP 04/24/20 1150

## 2020-04-24 NOTE — ED Triage Notes (Signed)
Patient c/o LFT ear pain x 5 days.   Patient has taken NSAID's and Tylenol w/ some relief of symptoms.  Patient has used an ear drop medication but patient noticed the medication is expired.   Patient denies drainage.   Patient endorses hearing difficulties.   History of Sinus infection r/t " seasonal allergies".

## 2020-10-11 ENCOUNTER — Other Ambulatory Visit: Payer: Self-pay | Admitting: Obstetrics and Gynecology

## 2020-10-11 DIAGNOSIS — Z1231 Encounter for screening mammogram for malignant neoplasm of breast: Secondary | ICD-10-CM

## 2020-10-11 DIAGNOSIS — Z13820 Encounter for screening for osteoporosis: Secondary | ICD-10-CM

## 2021-10-17 ENCOUNTER — Other Ambulatory Visit: Payer: Self-pay | Admitting: Obstetrics and Gynecology

## 2021-10-17 DIAGNOSIS — Z13820 Encounter for screening for osteoporosis: Secondary | ICD-10-CM

## 2021-10-17 DIAGNOSIS — Z1231 Encounter for screening mammogram for malignant neoplasm of breast: Secondary | ICD-10-CM

## 2022-01-03 ENCOUNTER — Ambulatory Visit
Admission: RE | Admit: 2022-01-03 | Discharge: 2022-01-03 | Disposition: A | Discharge status: Home / Self Care | Payer: BC Managed Care – PPO | Source: Ambulatory Visit | Attending: Obstetrics and Gynecology | Admitting: Obstetrics and Gynecology

## 2022-01-03 DIAGNOSIS — Z1231 Encounter for screening mammogram for malignant neoplasm of breast: Secondary | ICD-10-CM | POA: Insufficient documentation

## 2023-01-02 ENCOUNTER — Encounter: Payer: Self-pay | Admitting: Obstetrics and Gynecology

## 2023-01-02 ENCOUNTER — Other Ambulatory Visit: Payer: Self-pay | Admitting: Obstetrics and Gynecology

## 2023-01-02 DIAGNOSIS — Z13820 Encounter for screening for osteoporosis: Secondary | ICD-10-CM

## 2023-03-16 ENCOUNTER — Other Ambulatory Visit: Payer: Self-pay | Admitting: Obstetrics and Gynecology

## 2023-03-16 DIAGNOSIS — Z1231 Encounter for screening mammogram for malignant neoplasm of breast: Secondary | ICD-10-CM

## 2023-03-27 ENCOUNTER — Ambulatory Visit
Admission: RE | Admit: 2023-03-27 | Discharge: 2023-03-27 | Disposition: A | Discharge status: Home / Self Care | Payer: BC Managed Care – PPO | Source: Ambulatory Visit | Attending: Obstetrics and Gynecology | Admitting: Obstetrics and Gynecology

## 2023-03-27 DIAGNOSIS — Z1231 Encounter for screening mammogram for malignant neoplasm of breast: Secondary | ICD-10-CM | POA: Insufficient documentation

## 2023-05-25 ENCOUNTER — Ambulatory Visit
Admission: RE | Admit: 2023-05-25 | Discharge: 2023-05-25 | Disposition: A | Discharge status: Home / Self Care | Payer: BC Managed Care – PPO | Source: Ambulatory Visit | Attending: Obstetrics and Gynecology | Admitting: Obstetrics and Gynecology

## 2023-05-25 DIAGNOSIS — Z13820 Encounter for screening for osteoporosis: Secondary | ICD-10-CM | POA: Insufficient documentation

## 2024-05-27 ENCOUNTER — Other Ambulatory Visit: Payer: Self-pay | Admitting: Obstetrics and Gynecology

## 2024-05-27 DIAGNOSIS — Z1231 Encounter for screening mammogram for malignant neoplasm of breast: Secondary | ICD-10-CM

## 2024-06-29 ENCOUNTER — Ambulatory Visit
Admission: RE | Admit: 2024-06-29 | Discharge: 2024-06-29 | Disposition: A | Discharge status: Home / Self Care | Source: Ambulatory Visit | Attending: Obstetrics and Gynecology | Admitting: Obstetrics and Gynecology

## 2024-06-29 DIAGNOSIS — Z1231 Encounter for screening mammogram for malignant neoplasm of breast: Secondary | ICD-10-CM | POA: Diagnosis present

## 2024-07-07 ENCOUNTER — Ambulatory Visit: Payer: Self-pay | Admitting: Physician Assistant

## 2024-07-07 ENCOUNTER — Encounter: Payer: Self-pay | Admitting: Physician Assistant

## 2024-07-07 ENCOUNTER — Telehealth: Payer: Self-pay | Admitting: Physician Assistant

## 2024-07-07 VITALS — BP 130/80 | HR 80 | Temp 98.0°F | Resp 16 | Ht 65.5 in | Wt 190.0 lb

## 2024-07-07 DIAGNOSIS — E66811 Obesity, class 1: Secondary | ICD-10-CM

## 2024-07-07 DIAGNOSIS — Z7689 Persons encountering health services in other specified circumstances: Secondary | ICD-10-CM | POA: Diagnosis not present

## 2024-07-07 DIAGNOSIS — E039 Hypothyroidism, unspecified: Secondary | ICD-10-CM | POA: Diagnosis not present

## 2024-07-07 NOTE — Telephone Encounter (Signed)
 Patient will call back to schedule cpe-Carol Ball

## 2024-07-07 NOTE — Progress Notes (Signed)
 [facility] [address] [place], [place] [address]  Internal MEDICINE  Office Visit Note  Patient Name: Carol Ball  [id number]  [id number]  Date of Service: 07/07/2024   Complaints/HPI Pt is here for establishment of PCP. Chief Complaint  Patient presents with   New Patient (Initial Visit)   HPI Pt is here to establish care -primary and GYN practice by husband and wife previously, but PCP went on to teaching and has only been seeing GYN since. 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 -will be seeing dermatology in a few weeks -hx of breast reduction and feels much better since doing this -is currently on wegovy of 0.5mg , will need 1mg  in future but has several pens left for now, other meds will still be given by GYN -does take medication for hypothryoidism, labs already ordered by GYN for monitoring -sleeping well since starting on estradiol patch a few years ago to control menopausal symptoms -lives alone, 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   hyoscyamine (ANASPAZ) 0.125 MG TBDP disintergrating tablet Place 0.125 mg under the tongue every 4 (four) hours as needed for cramping or bladder spasms.   neomycin -polymyxin-hydrocortisone (CORTISPORIN) 3.5-10000-1 OTIC suspension Place 4 drops into the left ear 3 (three) times daily.   progesterone (PROMETRIUM) 200 MG capsule Take 200 mg by mouth daily.   semaglutide-weight management (WEGOVY) 0.25 MG/0.5ML SOAJ SQ injection  Inject 0.25 mg into the skin once a week.   thyroid  (ARMOUR) 30 MG tablet Take 30 mg by mouth daily before breakfast.   No facility-administered encounter medications on file as of 07/07/2024.    Surgical History: Past Surgical History:  Procedure Laterality Date   LASIK     REDUCTION MAMMAPLASTY Bilateral     Medical History: Past Medical History:  Diagnosis Date   Hypothyroidism 2008    Family History: Family History  Problem Relation Age of Onset   Breast cancer Maternal Aunt     Social History   Socioeconomic History   Marital status: Divorced    Spouse name: Not on file   Number of children: Not on file   Years of education: Not on file   Highest education level: Not on file  Occupational History   Not on file  Tobacco Use   Smoking status: Never   Smokeless tobacco: Never  Substance and Sexual Activity   Alcohol use: Never   Drug use: Never   Sexual activity: Not Currently  Other Topics Concern   Not on file  Social History Narrative   Not on file   Social Drivers of Health   Tobacco Use: Low Risk (07/07/2024)   Patient History    Smoking Tobacco Use: Never    Smokeless Tobacco Use: Never    Passive Exposure: Not on file  Financial Resource Strain: Not on file  Food Insecurity: Not on file  Transportation Needs: Not on file  Physical Activity: Not on file  Stress:  Not on file  Social Connections: Not on file  Intimate Partner Violence: Not on file  Depression [phone]): Low Risk (07/07/2024)   Depression (PHQ2-9)    PHQ-2 Score: 0  Alcohol Screen: Low Risk (07/07/2024)   Alcohol Screen    Last Alcohol Screening Score (AUDIT): 2  Housing: Not on file  Utilities: Not on file  Health Literacy: Not on file     Review of Systems  Constitutional:  Negative for chills, fatigue and unexpected weight change.  HENT:  Positive for postnasal drip. Negative for congestion, rhinorrhea, sneezing and sore throat.   Eyes:  Negative for redness.   Respiratory:  Negative for cough, chest tightness and shortness of breath.   Cardiovascular:  Negative for chest pain and palpitations.  Gastrointestinal:  Negative for abdominal pain, constipation, diarrhea, nausea and vomiting.  Genitourinary:  Negative for dysuria and frequency.  Musculoskeletal:  Negative for arthralgias, back pain, joint swelling and neck pain.  Skin:  Negative for rash.  Neurological: Negative.  Negative for tremors and numbness.  Hematological:  Negative for adenopathy. Does not bruise/bleed easily.  Psychiatric/Behavioral:  Negative for behavioral problems (Depression), sleep disturbance and suicidal ideas. The patient is not nervous/anxious.     Vital Signs: BP 130/80   Pulse 80   Temp 98 F (36.7 C)   Resp 16   Ht 5' 5.5 (1.664 m)   Wt 190 lb (86.2 kg)   SpO2 98%   BMI 31.14 kg/m    Physical Exam Vitals and nursing note reviewed.  Constitutional:      Appearance: Normal appearance.  HENT:     Head: Normocephalic and atraumatic.  Eyes:     Extraocular Movements: Extraocular movements intact.  Cardiovascular:     Rate and Rhythm: Normal rate and regular rhythm.  Pulmonary:     Effort: Pulmonary effort is normal.     Breath sounds: Normal breath sounds.  Skin:    General: Skin is warm and dry.  Neurological:     Mental Status: She is alert and oriented to person, place, and time.  Psychiatric:        Mood and Affect: Mood normal.        Behavior: Behavior normal.       Assessment/Plan: 1. Hypothyroidism, unspecified type (Primary) Labs already ordered by GYN and will monitor  2. Obesity (BMI 30.0-34.9) Continue wegovy, will plan to increase to 1mg  in future once current pens used  3. Encounter to establish care with new provider Will review labs once available and plan to schedule CPE   General Counseling: Burnard verbalizes understanding of the findings of todays visit and agrees with plan of treatment. I have discussed any further  diagnostic evaluation that may be needed or ordered today. We also reviewed her medications today. she has been encouraged to call the office with any questions or concerns that should arise related to todays visit.    Counseling:    No orders of the defined types were placed in this encounter.   No orders of the defined types were placed in this encounter.    This patient was seen by Tinnie Pro, PA-C in collaboration with Dr. Sigrid Bathe as a part of collaborative care agreement.   Time spent:30 Minutes
